# Patient Record
Sex: Female | Born: 2000
Health system: Southern US, Community
[De-identification: ages and names within clinical notes are randomized; demographics above are authoritative.]

---
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
## 2018-01-25 ENCOUNTER — Ambulatory Visit (INDEPENDENT_AMBULATORY_CARE_PROVIDER_SITE_OTHER): Payer: No Typology Code available for payment source | Admitting: Family Medicine

## 2018-01-25 ENCOUNTER — Encounter: Payer: Self-pay | Admitting: Family Medicine

## 2018-01-25 VITALS — BP 100/60 | HR 92 | Temp 98.9°F | Ht 63.0 in | Wt 126.6 lb

## 2018-01-25 DIAGNOSIS — B349 Viral infection, unspecified: Secondary | ICD-10-CM | POA: Diagnosis not present

## 2018-01-25 DIAGNOSIS — R6883 Chills (without fever): Secondary | ICD-10-CM | POA: Diagnosis not present

## 2018-01-25 DIAGNOSIS — J029 Acute pharyngitis, unspecified: Secondary | ICD-10-CM

## 2018-01-25 DIAGNOSIS — R52 Pain, unspecified: Secondary | ICD-10-CM

## 2018-01-25 LAB — POC INFLUENZA A&B (BINAX/QUICKVUE)
INFLUENZA A, POC: NEGATIVE
INFLUENZA B, POC: NEGATIVE

## 2018-01-25 LAB — POCT RAPID STREP A (OFFICE): Rapid Strep A Screen: NEGATIVE

## 2018-01-25 NOTE — Patient Instructions (Signed)
  Drink plenty of water. Rest.  Use tylenol and/or ibuprofen (only if you are eating) as needed for pain and fever. Bland diet. I suspect this will either develop into an upper respiratory illness (cold) or a stomach bug based on your exam. Supportive measures to treat symptoms (decongestants if needed for sinus pain, bland diet, imodium if needed for diarrhea, etc).

## 2018-01-25 NOTE — Progress Notes (Signed)
Chief Complaint  Patient presents with  . Generalized Body Aches    chills and fever, scratchy throat. Thinks she has the flu. No ear pain. Symptoms started yesterday.     Yesterday she started with body aches, fever last night (100 range). +fever and chills.  She has had slight sore throat, no runny nose, ear pain, cough, rashes Slight nausea off/on, denies abdominal pain, diarrhea. Denies urinary symptoms, vaginal discharge, pelvic pain.  Works at a daycare, no known sick contacts Has a lab final today  PMH, PSH, SH reviewed  Outpatient Encounter Medications as of 01/25/2018  Medication Sig Note  . citalopram (CELEXA) 20 MG tablet Take 20 mg by mouth daily.   Marland Kitchen. doxycycline (VIBRAMYCIN) 100 MG capsule TAKE 1 CAPSULE BY MOUTH TWICE A DAY WITH FOOD FOR ACNE   . tretinoin (RETIN-A) 0.025 % cream APPLY ON THE SKIN AT BEDTIME FOR ACNE   . ibuprofen (ADVIL,MOTRIN) 200 MG tablet Take 400 mg by mouth every 6 (six) hours as needed. 07/15/2017: Last dose 9am   No facility-administered encounter medications on file as of 01/25/2018.    Allergies  Allergen Reactions  . Azithromycin Rash   ROS:  See HPI for details.  PHYSICAL EXAM: BP 100/60   Pulse 92   Temp 98.9 F (37.2 C) (Tympanic)   Ht 5\' 3"  (1.6 m)   Wt 126 lb 9.6 oz (57.4 kg)   LMP 12/30/2017 (Exact Date)   BMI 22.43 kg/m   Mildly ill/tired appearing female in no distress HEENT: conjunctiva and sclera are clear, EOMI. TM's and EAC's normal. Nasal mucosa is mildly edematous with clear mucus. Slightly tender at maxillary sinuses. OP is clear, normal tonsils, no erythema, moist mucus membranes Neck: no lymphadenopathy or mass Heart: regular rate and rhythm Lungs: clear bilaterally Back: no CVA tenderness Abdomen: soft, normal bowel sounds. Slightly tender in epigastrium. No rebound, tenderness, mass. Extremities: no edema Skin: normal turgor, no rash Psych: normal mood, affect, hygiene and grooming  Rapid strep  negative Flu tests negative  ASSESSMENT/PLAN:   Viral syndrome - supportive measures reviewed. Suspect will develop more symptoms/worsen (URI vs GI symptoms discussed), and what to look out for/return  Sore throat - Plan: Rapid Strep A  Generalized body aches - Plan: POC Influenza A&B (Binax test)  Chills - Plan: POC Influenza A&B (Binax test)  Note written--out of school/work today, possibly tomorrow.   Drink plenty of water. Rest.  Use tylenol and/or ibuprofen (only if you are eating) as needed for pain and fever. Bland diet. I suspect this will either develop into an upper respiratory illness (cold) or a stomach bug based on your exam. Supportive measures to treat symptoms (decongestants if needed for sinus pain, bland diet, imodium if needed for diarrhea, etc).

## 2018-07-21 DIAGNOSIS — Z30011 Encounter for initial prescription of contraceptive pills: Secondary | ICD-10-CM | POA: Diagnosis not present

## 2018-10-19 DIAGNOSIS — R5383 Other fatigue: Secondary | ICD-10-CM | POA: Diagnosis not present

## 2018-10-19 DIAGNOSIS — E559 Vitamin D deficiency, unspecified: Secondary | ICD-10-CM | POA: Diagnosis not present

## 2018-12-22 DIAGNOSIS — J029 Acute pharyngitis, unspecified: Secondary | ICD-10-CM | POA: Diagnosis not present

## 2019-02-04 DIAGNOSIS — S80211A Abrasion, right knee, initial encounter: Secondary | ICD-10-CM | POA: Diagnosis not present

## 2019-02-04 DIAGNOSIS — S80212A Abrasion, left knee, initial encounter: Secondary | ICD-10-CM | POA: Diagnosis not present

## 2019-02-04 DIAGNOSIS — M25522 Pain in left elbow: Secondary | ICD-10-CM | POA: Diagnosis not present

## 2019-02-04 DIAGNOSIS — S50312A Abrasion of left elbow, initial encounter: Secondary | ICD-10-CM | POA: Diagnosis not present

## 2019-03-15 ENCOUNTER — Other Ambulatory Visit: Payer: Self-pay

## 2019-03-17 ENCOUNTER — Encounter: Payer: Self-pay | Admitting: Gynecology

## 2019-03-17 ENCOUNTER — Ambulatory Visit (INDEPENDENT_AMBULATORY_CARE_PROVIDER_SITE_OTHER): Payer: BLUE CROSS/BLUE SHIELD | Admitting: Gynecology

## 2019-03-17 ENCOUNTER — Other Ambulatory Visit: Payer: Self-pay

## 2019-03-17 VITALS — BP 116/74 | Ht 63.0 in | Wt 129.0 lb

## 2019-03-17 DIAGNOSIS — Z01419 Encounter for gynecological examination (general) (routine) without abnormal findings: Secondary | ICD-10-CM | POA: Diagnosis not present

## 2019-03-17 DIAGNOSIS — R102 Pelvic and perineal pain: Secondary | ICD-10-CM

## 2019-03-17 DIAGNOSIS — Z113 Encounter for screening for infections with a predominantly sexual mode of transmission: Secondary | ICD-10-CM

## 2019-03-17 DIAGNOSIS — R87618 Other abnormal cytological findings on specimens from cervix uteri: Secondary | ICD-10-CM | POA: Diagnosis not present

## 2019-03-17 NOTE — Patient Instructions (Signed)
Call if your pelvic cramping continues.  Follow-up in 1 year for annual exam

## 2019-03-17 NOTE — Addendum Note (Signed)
Addended by: Dayna Barker on: 03/17/2019 04:22 PM   Modules accepted: Orders

## 2019-03-17 NOTE — Progress Notes (Signed)
    Judy Cochran May 05, 2001 [id number]        18 y.o.  G0P0000 for annual gynecologic exam.  Complaining of 1 week history of lower abdominal cramping and bloating particularly with bowel movement.  Seems to be getting better than when it started a week ago.  No diarrhea or constipation.  No urinary symptoms such as frequency dysuria urgency.  No fever or chills.  Also noted faint color change with UPT at home on 1 kit and then repeated was negative.  She is on oral contraceptives and has not missed a pill periods having regular menses without skipped or irregular bleeding.  Past medical history,surgical history, problem list, medications, allergies, family history and social history were all reviewed and documented as reviewed in the EPIC chart.  ROS:  Performed with pertinent positives and negatives included in the history, assessment and plan.   Additional significant findings : None   Exam: Caryn Bee assistant Vitals:   03/17/19 1544  BP: 116/74  Weight: 129 lb (58.5 kg)  Height: _0  (1.6 m)   Body mass index is 22.85 kg/m.  General appearance:  Normal affect, orientation and appearance. Skin: Grossly normal HEENT: Without gross lesions.  No cervical or supraclavicular adenopathy. Thyroid normal.  Lungs:  Clear without wheezing, rales or rhonchi Cardiac: RR, without RMG Abdominal:  Soft, nontender, without masses, guarding, rebound, organomegaly or hernia Breasts:  Examined lying and sitting without masses, retractions, discharge or axillary adenopathy. Pelvic:  Ext, BUS, Vagina: Normal  Cervix: Normal.  GC/chlamydia, Pap smear done  Uterus: Retroverted, normal size, shape and contour, midline and mobile nontender   Adnexa: Without masses or tenderness    Anus and perineum: Normal    Assessment/Plan:  18 y.o. G0P0000 female for annual gynecologic exam.  Regular menses, oral contraceptives  1. Oral contraceptives.  Started through the student health this past year.   Doing well with them.  Had a faint color change on UPT last week x1 kit and then repeated negative.  She did this because of her abdominal cramping.  She had no bleeding and was not late for any menses.  Will check qualitative hCG today for completeness.  Assuming negative and her next menses is normal then will follow expectantly.  If irregular bleeding she will read present. 2. Pelvic cramping.  Sounds GI with exacerbation with bowel movements.  Has been going on for about a week.  No issues as far as dysmenorrhea or pain otherwise.  As it is getting better on its own will follow.  Will check urine analysis and CBC today.  If pain would continue she knows to call we will schedule an ultrasound. 3. STD screening.  GC/chlamydia screen done today.  Condoms with intercourse. 4. Breast health.  SBE monthly reviewed. 5. Pap smear done today as she will be turning 19 before her next annual exam. 6. The Gardasil series received. 7. Health maintenance.  No routine lab work done.  Follow-up in 1 year for annual exam sooner if issues with bleeding or pain continues.   Anastasio Auerbach MD, 4:13 PM 03/17/2019

## 2019-03-17 NOTE — Addendum Note (Signed)
Addended by: Dayna Barker on: 03/17/2019 04:23 PM   Modules accepted: Orders

## 2019-03-18 LAB — CBC WITH DIFFERENTIAL/PLATELET
Absolute Monocytes: 676 cells/uL (ref 200–950)
Basophils Absolute: 39 cells/uL (ref 0–200)
Basophils Relative: 0.6 %
Eosinophils Absolute: 72 cells/uL (ref 15–500)
Eosinophils Relative: 1.1 %
HCT: 43.2 % (ref 35.0–45.0)
Hemoglobin: 13.6 g/dL (ref 11.7–15.5)
Lymphs Abs: 2236 cells/uL (ref 850–3900)
MCH: 26 pg — ABNORMAL LOW (ref 27.0–33.0)
MCHC: 31.5 g/dL — ABNORMAL LOW (ref 32.0–36.0)
MCV: 82.6 fL (ref 80.0–100.0)
MPV: 11.1 fL (ref 7.5–12.5)
Monocytes Relative: 10.4 %
Neutro Abs: 3478 cells/uL (ref 1500–7800)
Neutrophils Relative %: 53.5 %
Platelets: 312 10*3/uL (ref 140–400)
RBC: 5.23 10*6/uL — ABNORMAL HIGH (ref 3.80–5.10)
RDW: 12.7 % (ref 11.0–15.0)
Total Lymphocyte: 34.4 %
WBC: 6.5 10*3/uL (ref 3.8–10.8)

## 2019-03-18 LAB — URINALYSIS, COMPLETE W/RFL CULTURE
Bacteria, UA: NONE SEEN /HPF
Bilirubin Urine: NEGATIVE
Glucose, UA: NEGATIVE
Hgb urine dipstick: NEGATIVE
Hyaline Cast: NONE SEEN /LPF
Ketones, ur: NEGATIVE
Leukocyte Esterase: NEGATIVE
Nitrites, Initial: NEGATIVE
Protein, ur: NEGATIVE
RBC / HPF: NONE SEEN /HPF (ref 0–2)
Specific Gravity, Urine: 1.01 (ref 1.001–1.03)
Squamous Epithelial / LPF: NONE SEEN /HPF (ref <=5)
WBC, UA: NONE SEEN /HPF (ref 0–5)
pH: 7 (ref 5.0–8.0)

## 2019-03-18 LAB — PAP IG W/ RFLX HPV ASCU

## 2019-03-18 LAB — NO CULTURE INDICATED

## 2019-03-18 LAB — HCG, QUANTITATIVE, PREGNANCY: HCG, Total, QN: <3 m[IU]/mL

## 2019-03-18 LAB — C. TRACHOMATIS/N. GONORRHOEAE RNA
C. trachomatis RNA, TMA: NOT DETECTED
N. gonorrhoeae RNA, TMA: NOT DETECTED

## 2019-07-04 ENCOUNTER — Encounter: Payer: Self-pay | Admitting: Gynecology

## 2019-07-21 DIAGNOSIS — M545 Low back pain: Secondary | ICD-10-CM | POA: Diagnosis not present

## 2019-07-26 DIAGNOSIS — M9903 Segmental and somatic dysfunction of lumbar region: Secondary | ICD-10-CM | POA: Diagnosis not present

## 2019-07-26 DIAGNOSIS — M9905 Segmental and somatic dysfunction of pelvic region: Secondary | ICD-10-CM | POA: Diagnosis not present

## 2019-07-26 DIAGNOSIS — M25551 Pain in right hip: Secondary | ICD-10-CM | POA: Diagnosis not present

## 2019-07-26 DIAGNOSIS — M5386 Other specified dorsopathies, lumbar region: Secondary | ICD-10-CM | POA: Diagnosis not present

## 2019-08-04 DIAGNOSIS — M25551 Pain in right hip: Secondary | ICD-10-CM | POA: Diagnosis not present

## 2019-08-04 DIAGNOSIS — M9903 Segmental and somatic dysfunction of lumbar region: Secondary | ICD-10-CM | POA: Diagnosis not present

## 2019-08-04 DIAGNOSIS — M5386 Other specified dorsopathies, lumbar region: Secondary | ICD-10-CM | POA: Diagnosis not present

## 2019-08-04 DIAGNOSIS — M9905 Segmental and somatic dysfunction of pelvic region: Secondary | ICD-10-CM | POA: Diagnosis not present

## 2019-08-08 DIAGNOSIS — M5136 Other intervertebral disc degeneration, lumbar region: Secondary | ICD-10-CM | POA: Diagnosis not present

## 2019-08-09 DIAGNOSIS — M9905 Segmental and somatic dysfunction of pelvic region: Secondary | ICD-10-CM | POA: Diagnosis not present

## 2019-08-09 DIAGNOSIS — M9903 Segmental and somatic dysfunction of lumbar region: Secondary | ICD-10-CM | POA: Diagnosis not present

## 2019-08-09 DIAGNOSIS — M5386 Other specified dorsopathies, lumbar region: Secondary | ICD-10-CM | POA: Diagnosis not present

## 2019-08-09 DIAGNOSIS — M25551 Pain in right hip: Secondary | ICD-10-CM | POA: Diagnosis not present

## 2019-08-12 DIAGNOSIS — N899 Noninflammatory disorder of vagina, unspecified: Secondary | ICD-10-CM | POA: Diagnosis not present

## 2019-08-12 DIAGNOSIS — Z113 Encounter for screening for infections with a predominantly sexual mode of transmission: Secondary | ICD-10-CM | POA: Diagnosis not present

## 2019-08-30 DIAGNOSIS — M5386 Other specified dorsopathies, lumbar region: Secondary | ICD-10-CM | POA: Diagnosis not present

## 2019-08-30 DIAGNOSIS — M9903 Segmental and somatic dysfunction of lumbar region: Secondary | ICD-10-CM | POA: Diagnosis not present

## 2019-08-30 DIAGNOSIS — M25551 Pain in right hip: Secondary | ICD-10-CM | POA: Diagnosis not present

## 2019-08-30 DIAGNOSIS — M9905 Segmental and somatic dysfunction of pelvic region: Secondary | ICD-10-CM | POA: Diagnosis not present

## 2019-09-06 DIAGNOSIS — M9905 Segmental and somatic dysfunction of pelvic region: Secondary | ICD-10-CM | POA: Diagnosis not present

## 2019-09-06 DIAGNOSIS — M25551 Pain in right hip: Secondary | ICD-10-CM | POA: Diagnosis not present

## 2019-09-06 DIAGNOSIS — M5386 Other specified dorsopathies, lumbar region: Secondary | ICD-10-CM | POA: Diagnosis not present

## 2019-09-06 DIAGNOSIS — M9903 Segmental and somatic dysfunction of lumbar region: Secondary | ICD-10-CM | POA: Diagnosis not present

## 2019-09-13 DIAGNOSIS — M25551 Pain in right hip: Secondary | ICD-10-CM | POA: Diagnosis not present

## 2019-09-13 DIAGNOSIS — M5386 Other specified dorsopathies, lumbar region: Secondary | ICD-10-CM | POA: Diagnosis not present

## 2019-09-13 DIAGNOSIS — M9905 Segmental and somatic dysfunction of pelvic region: Secondary | ICD-10-CM | POA: Diagnosis not present

## 2019-09-13 DIAGNOSIS — M9903 Segmental and somatic dysfunction of lumbar region: Secondary | ICD-10-CM | POA: Diagnosis not present

## 2019-09-15 DIAGNOSIS — M9905 Segmental and somatic dysfunction of pelvic region: Secondary | ICD-10-CM | POA: Diagnosis not present

## 2019-09-15 DIAGNOSIS — M5386 Other specified dorsopathies, lumbar region: Secondary | ICD-10-CM | POA: Diagnosis not present

## 2019-09-15 DIAGNOSIS — M9903 Segmental and somatic dysfunction of lumbar region: Secondary | ICD-10-CM | POA: Diagnosis not present

## 2019-09-15 DIAGNOSIS — M545 Low back pain: Secondary | ICD-10-CM | POA: Diagnosis not present

## 2019-09-15 DIAGNOSIS — M25551 Pain in right hip: Secondary | ICD-10-CM | POA: Diagnosis not present

## 2019-09-20 DIAGNOSIS — M545 Low back pain: Secondary | ICD-10-CM | POA: Diagnosis not present

## 2019-09-22 DIAGNOSIS — M5126 Other intervertebral disc displacement, lumbar region: Secondary | ICD-10-CM | POA: Diagnosis not present

## 2019-09-22 DIAGNOSIS — M545 Low back pain: Secondary | ICD-10-CM | POA: Diagnosis not present

## 2019-09-28 DIAGNOSIS — M545 Low back pain: Secondary | ICD-10-CM | POA: Diagnosis not present

## 2019-09-28 DIAGNOSIS — M5126 Other intervertebral disc displacement, lumbar region: Secondary | ICD-10-CM | POA: Diagnosis not present
